# Patient Record
Sex: Female | Born: 1965 | Race: Black or African American | Hispanic: No | Smoking: Never smoker
Health system: Southern US, Community
[De-identification: ages and names within clinical notes are randomized; demographics above are authoritative.]

## PROBLEM LIST (undated history)

## (undated) HISTORY — PX: BACK SURGERY: SHX140

---

## 2015-12-27 ENCOUNTER — Encounter: Payer: Self-pay | Admitting: Emergency Medicine

## 2015-12-27 ENCOUNTER — Emergency Department: Payer: BLUE CROSS/BLUE SHIELD

## 2015-12-27 ENCOUNTER — Emergency Department
Admission: EM | Admit: 2015-12-27 | Discharge: 2015-12-27 | Disposition: A | Discharge status: Home / Self Care | Payer: BLUE CROSS/BLUE SHIELD | Attending: Emergency Medicine | Admitting: Emergency Medicine

## 2015-12-27 DIAGNOSIS — R0789 Other chest pain: Secondary | ICD-10-CM | POA: Insufficient documentation

## 2015-12-27 DIAGNOSIS — Z7982 Long term (current) use of aspirin: Secondary | ICD-10-CM | POA: Diagnosis not present

## 2015-12-27 LAB — CBC WITH DIFFERENTIAL/PLATELET
BASOS ABS: 0 10*3/uL (ref 0–0.1)
EOS ABS: 0 10*3/uL (ref 0–0.7)
Eosinophils Relative: 1 %
HCT: 34.2 % — ABNORMAL LOW (ref 35.0–47.0)
HEMOGLOBIN: 10.1 g/dL — AB (ref 12.0–16.0)
Lymphocytes Relative: 26 %
Lymphs Abs: 1.8 10*3/uL (ref 1.0–3.6)
MCH: 18.3 pg — ABNORMAL LOW (ref 26.0–34.0)
MCHC: 29.5 g/dL — AB (ref 32.0–36.0)
MCV: 62.2 fL — ABNORMAL LOW (ref 80.0–100.0)
Monocytes Absolute: 0.7 10*3/uL (ref 0.2–0.9)
Neutro Abs: 4.3 10*3/uL (ref 1.4–6.5)
Platelets: 250 10*3/uL (ref 150–440)
RBC: 5.5 MIL/uL — ABNORMAL HIGH (ref 3.80–5.20)
RDW: 19.1 % — AB (ref 11.5–14.5)
WBC: 6.9 10*3/uL (ref 3.6–11.0)

## 2015-12-27 LAB — COMPREHENSIVE METABOLIC PANEL
ALT: 17 U/L (ref 14–54)
ANION GAP: 7 (ref 5–15)
AST: 20 U/L (ref 15–41)
Albumin: 3.7 g/dL (ref 3.5–5.0)
Alkaline Phosphatase: 38 U/L (ref 38–126)
BILIRUBIN TOTAL: 0.6 mg/dL (ref 0.3–1.2)
BUN: 13 mg/dL (ref 6–20)
CHLORIDE: 108 mmol/L (ref 101–111)
CO2: 22 mmol/L (ref 22–32)
Calcium: 8.9 mg/dL (ref 8.9–10.3)
Creatinine, Ser: 0.65 mg/dL (ref 0.44–1.00)
Glucose, Bld: 85 mg/dL (ref 65–99)
POTASSIUM: 3.9 mmol/L (ref 3.5–5.1)
Sodium: 137 mmol/L (ref 135–145)
TOTAL PROTEIN: 6.9 g/dL (ref 6.5–8.1)

## 2015-12-27 LAB — LIPASE, BLOOD: LIPASE: 21 U/L (ref 11–51)

## 2015-12-27 LAB — TROPONIN I

## 2015-12-27 MED ORDER — KETOROLAC TROMETHAMINE 30 MG/ML IJ SOLN
30.0000 mg | Freq: Once | INTRAMUSCULAR | Status: AC
  Administered 2015-12-27: 30 mg via INTRAVENOUS
  Filled 2015-12-27: qty 1

## 2015-12-27 NOTE — ED Triage Notes (Signed)
Pt presents with left sided chest pain radiating down into left arm started around 1030 today while in the shower. C/o pressure in chest. NAD skin warm and dry.

## 2015-12-27 NOTE — ED Provider Notes (Addendum)
[facility] Emergency Department Provider Note  ____________________________________________   I have reviewed the triage vital signs and the nursing notes.   HISTORY  Chief Complaint Chest Pain    HPI Linda Young is a 50 y.o. female who has family history of CAD but herself has no other significant risk factors for CAD or blood clot presents today complaining of left-sided chest wall pain. Patient states that she was raising her arm over her head to shampoo her head in the shower and she felt like she pulled a muscle in the left upper chest. At this time she has no pain elicited touches that area or changes position or pulls up in the bed. The pain is "like a muscle pull". She has no numbness or weakness. She denies any pleuritic chest pain shortness of breath nausea or vomiting. She has no personal or family history of PE or DVT. She's had no recent travel. No calf pain or swelling. He is not on any exogenous estrogens. She has not had any recent surgery. She has been short no risk factors for PE. Patient does work out and exercise regularly and is known to have a low heart rate, she has a history of mild anemia. However, she has had no exertional symptoms. At this time, she states it only hurts if she pulls herself up in the bed using that arm or touches it. She has no weakness. She did have a "tingling sensation" in her upper arm but that is gone now.      History reviewed. No pertinent past medical history.  There are no active problems to display for this patient.   Past Surgical History:  Procedure Laterality Date  . BACK SURGERY      Current Outpatient Rx  . Order #: [id number] Class: Historical Med  . Order #: [id number] Class: Historical Med  . Order #: [id number] Class: Historical Med    Allergies Review of patient's allergies indicates no known allergies.  No family history on file.  Social History Social History  Substance Use Topics  .  Smoking status: Never Smoker  . Smokeless tobacco: Never Used  . Alcohol use No    Review of Systems Constitutional: No fever/chills Eyes: No visual changes. ENT: No sore throat. No stiff neck no neck pain Cardiovascular: See history of present illness regarding chest pain. Respiratory: Denies shortness of breath. Gastrointestinal:   no vomiting.  No diarrhea.  No constipation. Genitourinary: Negative for dysuria. Musculoskeletal: Negative lower extremity swelling Skin: Negative for rash. Neurological: Negative for severe headaches, focal weakness or numbness. 10-point ROS otherwise negative.  ____________________________________________   PHYSICAL EXAM:  VITAL SIGNS: ED Triage Vitals  Enc Vitals Group     BP 12/27/15 1144 113/62     Pulse Rate 12/27/15 1144 (!) 51     Resp 12/27/15 1144 20     Temp 12/27/15 1144 98 F (36.7 C)     Temp Source 12/27/15 1144 Oral     SpO2 12/27/15 1144 100 %     Weight --      Height --      Head Circumference --      Peak Flow --      Pain Score 12/27/15 1142 6     Pain Loc --      Pain Edu? --      Excl. in GC? --     Constitutional: Alert and oriented. Well appearing and in no acute distress. Eyes: Conjunctivae are normal. PERRL. EOMI. Head:  Atraumatic. Nose: No congestion/rhinnorhea. Mouth/Throat: Mucous membranes are moist.  Oropharynx non-erythematous. Neck: No stridor.   Nontender with no meningismus Cardiovascular: Normal rate, regular rhythm. Grossly normal heart sounds.  Good peripheral circulation. Respiratory: Normal respiratory effort.  No retractions. Lungs CTAB. Chest: Patient is status palpation left chest wall pectoralis muscle area patient states "ouch that the pain right there" and pulls back, there is no crepitus is no flail chest, there is no shingles or other lesions. Patient also has minimal tenderness to palpation of the trapezius muscle which is also productive of her discomfort. Abdominal: Soft and  nontender. No distention. No guarding no rebound Back:  There is no focal tenderness or step off.  there is no midline tenderness there are no lesions noted. there is no CVA tenderness Musculoskeletal: No lower extremity tenderness, no upper extremity tenderness. No joint effusions, no DVT signs strong distal pulses no edema, strong distal pulses. Neurologic:  Normal speech and language. No gross focal neurologic deficits are appreciated.  Skin:  Skin is warm, dry and intact. No rash noted. Psychiatric: Mood and affect are normal. Speech and behavior are normal.  ____________________________________________   LABS (all labs ordered are listed, but only abnormal results are displayed)  Labs Reviewed  CBC WITH DIFFERENTIAL/PLATELET - Abnormal; Notable for the following:       Result Value   RBC 5.50 (*)    Hemoglobin 10.1 (*)    HCT 34.2 (*)    MCV 62.2 (*)    MCH 18.3 (*)    MCHC 29.5 (*)    RDW 19.1 (*)    All other components within normal limits  COMPREHENSIVE METABOLIC PANEL  LIPASE, BLOOD  TROPONIN I   ____________________________________________  EKG  I personally interpreted any EKGs ordered by me or triage Sinus bradycardia rate 52 bpm no acute ST elevation or acute ST depression, borderline LAD. No acute ischemic changes noted ____________________________________________  RADIOLOGY  I reviewed any imaging ordered by me or triage that were performed during my shift and, if possible, patient and/or family made aware of any abnormal findings. ____________________________________________   PROCEDURES  Procedure(s) performed: None  Procedures  Critical Care performed: None  ____________________________________________   INITIAL IMPRESSION / ASSESSMENT AND PLAN / ED COURSE  Pertinent labs & imaging results that were available during my care of the patient were reviewed by me and considered in my medical decision making (see chart for details). Patient presents  today complaining of very reproducible chest wall pain after raising her arm over her head. It is very reproducible especially if one does see exact motion that she did to elicit the pain in the first place. She has very low risk factors for ACS and I feel with 2 sets chronic markers and this exam she is safe to go home. Patient agrees per she is eating ice. This time in no acute distress. We did give her Toradol and her pain is nearly gone. Chest x-ray and blood work is quite reassuring EKG is reassuring. Patient does exercises a great deal and I think this likely splinter bradycardia presently no evidence of inferior wall MI. Patient is perk negative, has reproduce will chest wall pain I do not believe that a CT scan is in the patient's best interest given the radiation burden and low probability of finding pathology.  At this time, there does not appear to be clinical evidence to support the diagnosis of pulmonary embolus, dissection, myocarditis, endocarditis, pericarditis, pericardial tamponade, acute coronary syndrome, pneumothorax, pneumonia,  or any other acute intrathoracic pathology that will require admission or acute intervention. Nor is there evidence of any significant intra-abdominal pathology causing this discomfort.  ----------------------------------------- 3:35 PM on 12/27/2015 -----------------------------------------  Signed out to dr. Sharma Covert at the end of my shift.  Clinical Course   ____________________________________________   FINAL CLINICAL IMPRESSION(S) / ED DIAGNOSES  Final diagnoses:  None      This chart was dictated using voice recognition software.  Despite best efforts to proofread,  errors can occur which can change meaning.      Jeanmarie Plant, MD 12/27/15 1459    Jeanmarie Plant, MD 12/27/15 1535

## 2016-08-10 ENCOUNTER — Emergency Department
Admission: EM | Admit: 2016-08-10 | Discharge: 2016-08-10 | Disposition: A | Discharge status: Home / Self Care | Payer: BLUE CROSS/BLUE SHIELD | Attending: Emergency Medicine | Admitting: Emergency Medicine

## 2016-08-10 DIAGNOSIS — Z79899 Other long term (current) drug therapy: Secondary | ICD-10-CM | POA: Insufficient documentation

## 2016-08-10 DIAGNOSIS — R51 Headache: Secondary | ICD-10-CM | POA: Diagnosis present

## 2016-08-10 DIAGNOSIS — R519 Headache, unspecified: Secondary | ICD-10-CM

## 2016-08-10 DIAGNOSIS — Z7982 Long term (current) use of aspirin: Secondary | ICD-10-CM | POA: Insufficient documentation

## 2016-08-10 DIAGNOSIS — I10 Essential (primary) hypertension: Secondary | ICD-10-CM | POA: Diagnosis not present

## 2016-08-10 LAB — BASIC METABOLIC PANEL
ANION GAP: 4 — AB (ref 5–15)
BUN: 13 mg/dL (ref 6–20)
CALCIUM: 8.9 mg/dL (ref 8.9–10.3)
CO2: 27 mmol/L (ref 22–32)
Chloride: 107 mmol/L (ref 101–111)
Creatinine, Ser: 0.65 mg/dL (ref 0.44–1.00)
GFR calc Af Amer: >60 mL/min (ref >60)
GFR calc non Af Amer: >60 mL/min (ref >60)
GLUCOSE: 85 mg/dL (ref 65–99)
POTASSIUM: 3.8 mmol/L (ref 3.5–5.1)
Sodium: 138 mmol/L (ref 135–145)

## 2016-08-10 LAB — CBC
HEMATOCRIT: 34.3 % — AB (ref 35.0–47.0)
Hemoglobin: 10.6 g/dL — ABNORMAL LOW (ref 12.0–16.0)
MCH: 20.6 pg — AB (ref 26.0–34.0)
MCHC: 30.9 g/dL — ABNORMAL LOW (ref 32.0–36.0)
MCV: 66.4 fL — AB (ref 80.0–100.0)
Platelets: 281 10*3/uL (ref 150–440)
RBC: 5.17 MIL/uL (ref 3.80–5.20)
RDW: 19 % — ABNORMAL HIGH (ref 11.5–14.5)
WBC: 6.9 10*3/uL (ref 3.6–11.0)

## 2016-08-10 LAB — TROPONIN I: Troponin I: <0.03 ng/mL (ref <0.03)

## 2016-08-10 MED ORDER — KETOROLAC TROMETHAMINE 30 MG/ML IJ SOLN
30.0000 mg | Freq: Once | INTRAMUSCULAR | Status: AC
Start: 2016-08-10 — End: 2016-08-10
  Administered 2016-08-10: 30 mg via INTRAVENOUS
  Filled 2016-08-10: qty 1

## 2016-08-10 MED ORDER — DIPHENHYDRAMINE HCL 50 MG/ML IJ SOLN
25.0000 mg | Freq: Once | INTRAMUSCULAR | Status: AC
  Administered 2016-08-10: 25 mg via INTRAVENOUS
  Filled 2016-08-10: qty 1

## 2016-08-10 MED ORDER — METOCLOPRAMIDE HCL 5 MG/ML IJ SOLN
10.0000 mg | Freq: Once | INTRAMUSCULAR | Status: AC
  Administered 2016-08-10: 10 mg via INTRAVENOUS
  Filled 2016-08-10: qty 2

## 2016-08-10 MED ORDER — BUTALBITAL-APAP-CAFFEINE 50-325-40 MG PO TABS: 1.0000 | ORAL_TABLET | Freq: Four times a day (QID) | ORAL | 0 refills | Status: AC | PRN

## 2016-08-10 MED ORDER — SODIUM CHLORIDE 0.9 % IV BOLUS (SEPSIS)
1000.0000 mL | Freq: Once | INTRAVENOUS | Status: AC
  Administered 2016-08-10: 1000 mL via INTRAVENOUS

## 2016-08-10 NOTE — ED Triage Notes (Signed)
Pt in with co headache since this am, hx of migraines but states feels the same. States has blurry vision at times and some visual disturbances, no light sensitivity but does have nausea.

## 2016-08-10 NOTE — ED Provider Notes (Signed)
[facility] Emergency Department Provider Note  Time seen: 9:07 PM  I have reviewed the triage vital signs and the nursing notes.   HISTORY  Chief Complaint Headache    HPI Linda Young is a 51 y.o. female with no past medical history who presents the emergency department with high blood pressure and a headache. According to the patient throughout the day today she has had an intermittent headache which has become constant, moderate in severity. Describes as a pressure sensation across her entire forehead and pushing on her temples. Denies photophobia, but states she has been seeing spots in her vision today and feels like her vision might be somewhat blurred. States she took her blood pressure due to the symptoms and it was 130 systolic she states her normal systolic blood pressure is 100-110. She became concerned so she came to the emergency department for evaluation. Denies any chest pain, abdominal pain, nausea, vomiting, diarrhea. Denies any weakness or numbness. Continue same moderate headache at this time with a blood pressure currently 135/82, the patient again states this is very high for her.  No past medical history on file.  There are no active problems to display for this patient.   Past Surgical History:  Procedure Laterality Date  . BACK SURGERY      Prior to Admission medications   Medication Sig Start Date End Date Taking? Authorizing Provider  aspirin EC 81 MG tablet Take 81 mg by mouth daily.    Historical Provider, MD  ferrous sulfate 325 (65 FE) MG tablet Take 325 mg by mouth daily with breakfast.    Historical Provider, MD  ibuprofen (ADVIL,MOTRIN) 200 MG tablet Take 200 mg by mouth every 6 (six) hours as needed.    Historical Provider, MD    No Known Allergies  No family history on file.  Social History Social History  Substance Use Topics  . Smoking status: Never Smoker  . Smokeless tobacco: Never Used  . Alcohol use No     Review of Systems Constitutional: Negative for fever. Cardiovascular: Negative for chest pain. Respiratory: Negative for shortness of breath. Gastrointestinal: Negative for abdominal pain Neurological: Moderate headache. Denies focal weakness or numbness. 10-point ROS otherwise negative.  ____________________________________________   PHYSICAL EXAM:  VITAL SIGNS: ED Triage Vitals  Enc Vitals Group     BP 08/10/16 2045 135/82     Pulse Rate 08/10/16 2045 64     Resp 08/10/16 2045 18     Temp 08/10/16 2045 98.2 F (36.8 C)     Temp Source 08/10/16 2045 Oral     SpO2 08/10/16 2045 100 %     Weight 08/10/16 2046 190 lb (86.2 kg)     Height 08/10/16 2046 5\' 4"  (1.626 m)     Head Circumference --      Peak Flow --      Pain Score 08/10/16 2046 5     Pain Loc --      Pain Edu? --      Excl. in GC? --     Constitutional: Alert and oriented. Well appearing and in no distress. Eyes: Normal exam, No photophobia. ENT   Head: Normocephalic and atraumatic.   Mouth/Throat: Mucous membranes are moist. Cardiovascular: Normal rate, regular rhythm. No murmur Respiratory: Normal respiratory effort without tachypnea nor retractions. Breath sounds are clear Gastrointestinal: Soft and nontender. No distention.   Musculoskeletal: Nontender with normal range of motion in all extremities.  Neurologic:  Normal speech and language. No  gross focal neurologic deficits. Equal grip strengths bilaterally. 5/5 motor in all extremities. No pronator drift. Cranial nerves intact.  Skin:  Skin is warm, dry and intact.  Psychiatric: Mood and affect are normal.   ____________________________________________    INITIAL IMPRESSION / ASSESSMENT AND PLAN / ED COURSE  Pertinent labs & imaging results that were available during my care of the patient were reviewed by me and considered in my medical decision making (see chart for details).  The patient presents to the emergency department for a  headache and hypertension. Patient states mild headache earlier today which progressively worsened. Patient's main concern appears to be her elevated blood pressure currently 135 systolic which she states is very high for her. We will check labs, treat with Toradol, Reglan, Benadryl and IV fluids and closely monitor in the emergency department. Overall the patient appears very well with a normal physical exam including neurological exam.   Patient's blood pressures down to 102, continues to state mild headache. Patient appears very well, resting comfortably in bed. Patient's labs are within normal limits including negative troponin. We will discharge the patient from the emergency department with PCP follow-up. I will prescribe Fioricet if the patient continues to have a headache tomorrow.  ____________________________________________   FINAL CLINICAL IMPRESSION(S) / ED DIAGNOSES  Hypertension Headache    Minna AntisKevin Duayne Brideau, MD 08/10/16 2250

## 2017-05-21 IMAGING — CR DG CHEST 2V
2 series · 2 of 2 positions shown · non-contrast
Comparison: None.

CLINICAL DATA: Chest pain and shortness of breath

EXAM:
CHEST  2 VIEW

[chest pa]
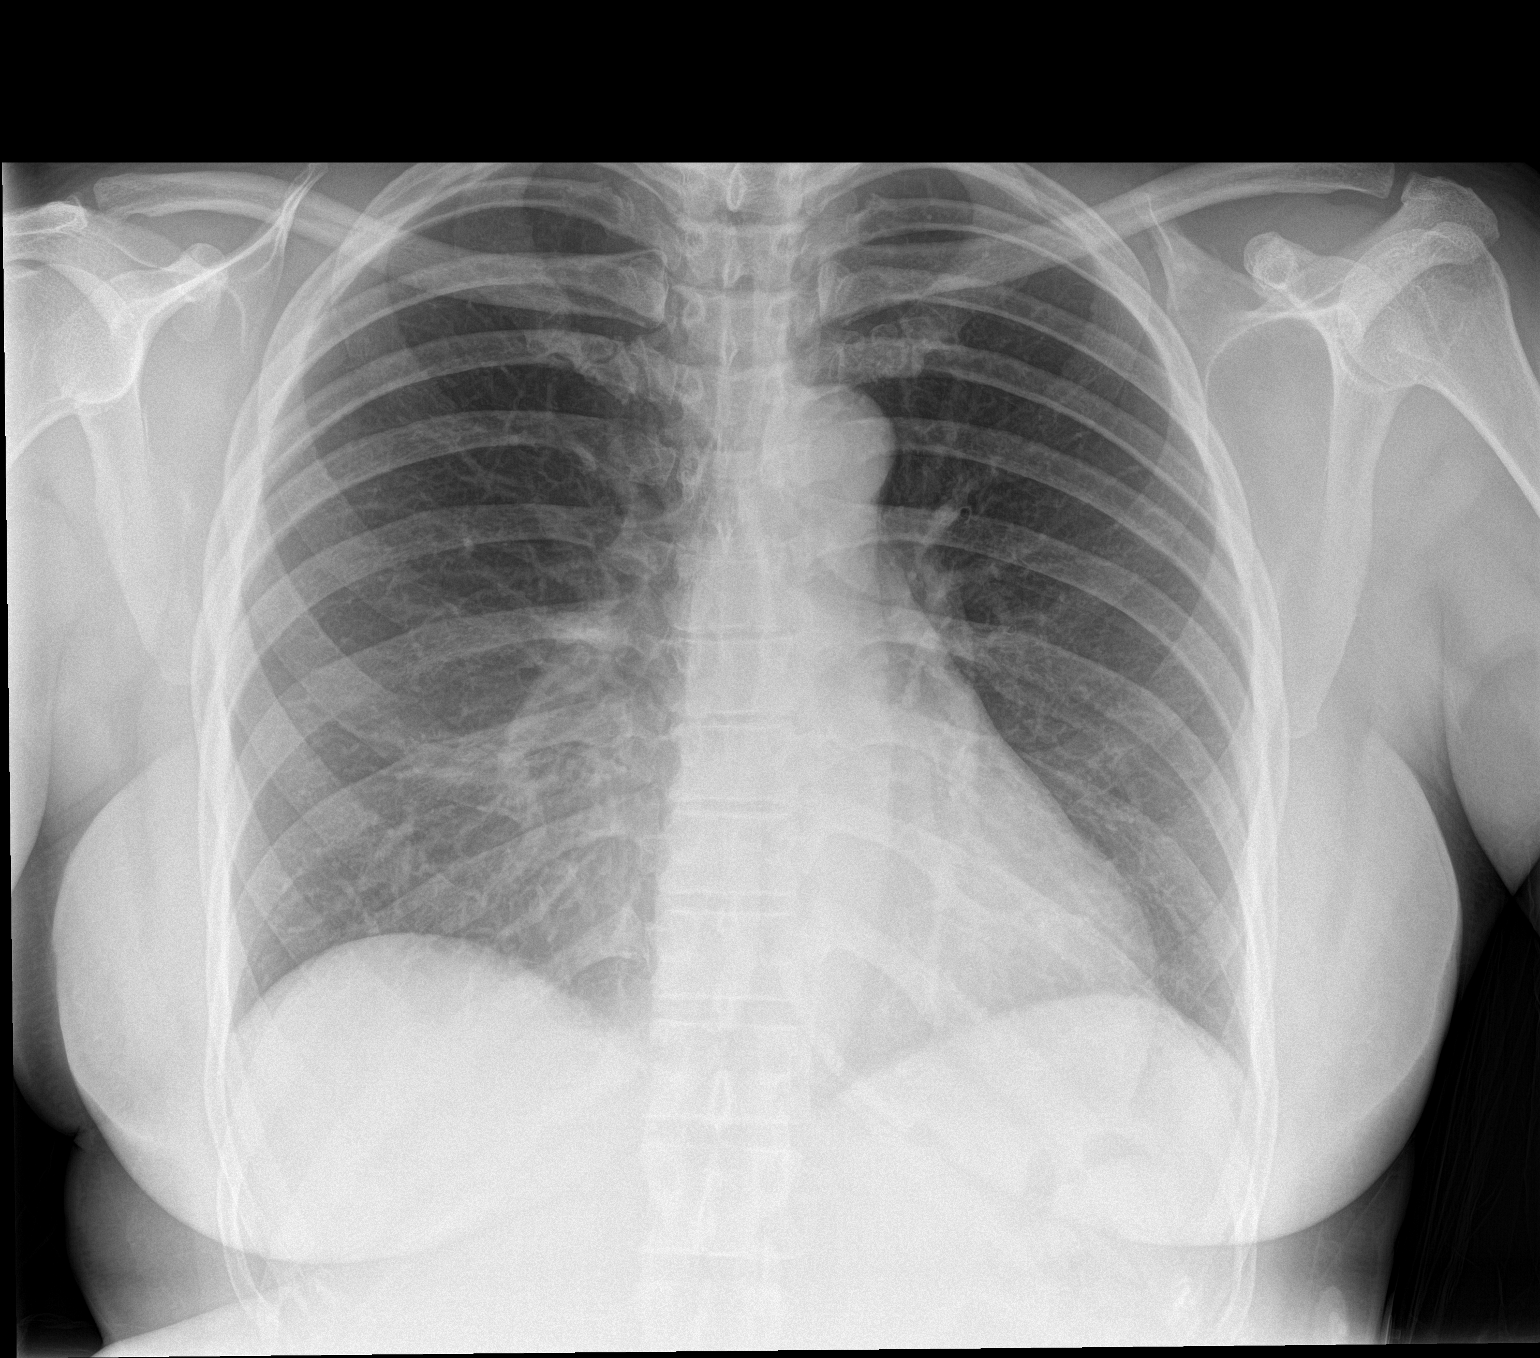

[chest lat]
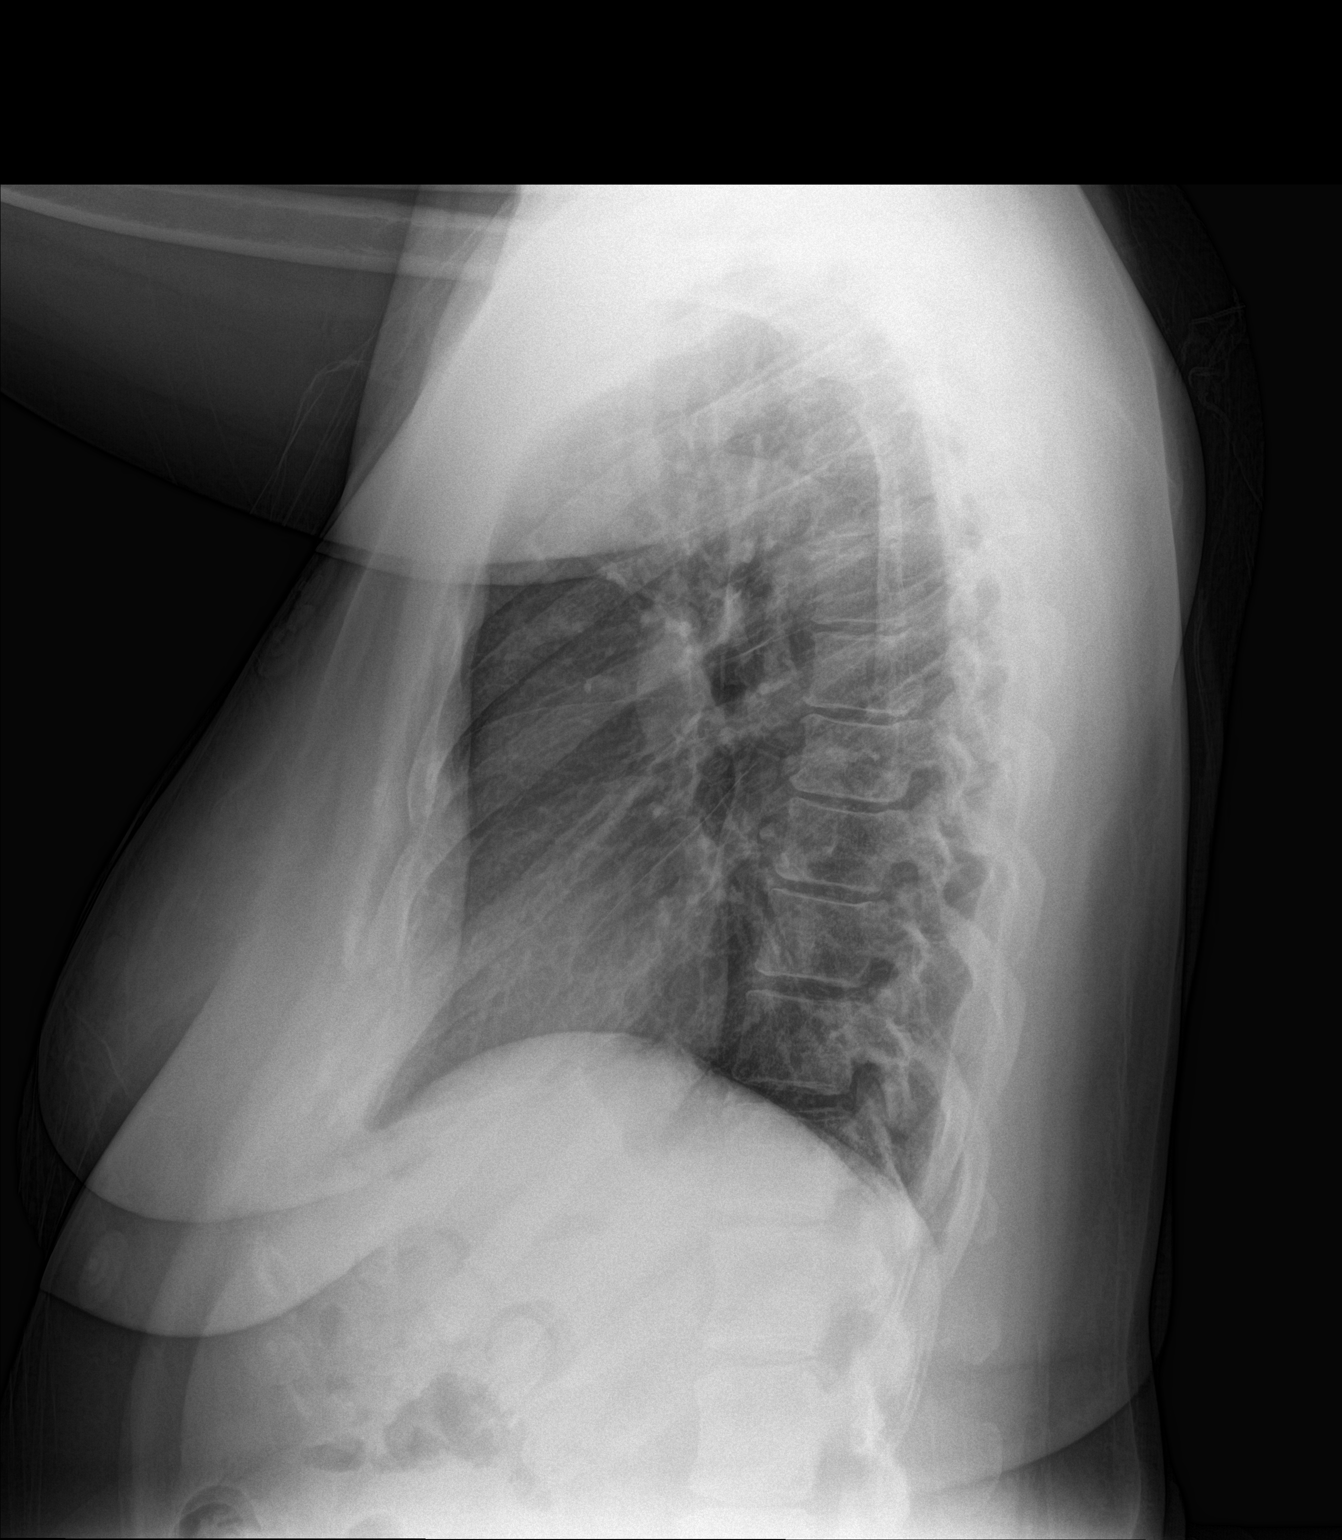

[2 of 2 positions shown; findings below may reference images not displayed]

FINDINGS: Lungs are clear. The heart size and pulmonary vascularity are
normal. No adenopathy. No pneumothorax. No bone lesions.
IMPRESSION: No edema or consolidation.

## 2018-03-07 ENCOUNTER — Encounter: Payer: Self-pay | Admitting: Emergency Medicine

## 2018-03-07 ENCOUNTER — Other Ambulatory Visit: Payer: Self-pay

## 2018-03-07 ENCOUNTER — Emergency Department
Admission: EM | Admit: 2018-03-07 | Discharge: 2018-03-08 | Disposition: A | Discharge status: Home / Self Care | Payer: BLUE CROSS/BLUE SHIELD | Attending: Emergency Medicine | Admitting: Emergency Medicine

## 2018-03-07 DIAGNOSIS — Z7982 Long term (current) use of aspirin: Secondary | ICD-10-CM | POA: Insufficient documentation

## 2018-03-07 DIAGNOSIS — R109 Unspecified abdominal pain: Secondary | ICD-10-CM

## 2018-03-07 DIAGNOSIS — R1032 Left lower quadrant pain: Secondary | ICD-10-CM | POA: Insufficient documentation

## 2018-03-07 DIAGNOSIS — Z79899 Other long term (current) drug therapy: Secondary | ICD-10-CM | POA: Diagnosis not present

## 2018-03-07 LAB — CBC WITH DIFFERENTIAL/PLATELET
Basophils Absolute: 0.1 10*3/uL (ref 0–0.1)
Basophils Relative: 1 %
Eosinophils Absolute: 0.1 10*3/uL (ref 0–0.7)
Eosinophils Relative: 1 %
HCT: 36.2 % (ref 35.0–47.0)
HEMOGLOBIN: 11.4 g/dL — AB (ref 12.0–16.0)
LYMPHS ABS: 3.4 10*3/uL (ref 1.0–3.6)
LYMPHS PCT: 28 %
MCH: 20.4 pg — AB (ref 26.0–34.0)
MCHC: 31.5 g/dL — ABNORMAL LOW (ref 32.0–36.0)
MCV: 64.7 fL — AB (ref 80.0–100.0)
Monocytes Absolute: 0.8 10*3/uL (ref 0.2–0.9)
Monocytes Relative: 7 %
NEUTROS PCT: 63 %
Neutro Abs: 7.5 10*3/uL — ABNORMAL HIGH (ref 1.4–6.5)
Platelets: 322 10*3/uL (ref 150–440)
RBC: 5.59 MIL/uL — AB (ref 3.80–5.20)
RDW: 18.6 % — ABNORMAL HIGH (ref 11.5–14.5)
WBC: 11.9 10*3/uL — ABNORMAL HIGH (ref 3.6–11.0)

## 2018-03-07 LAB — URINALYSIS, COMPLETE (UACMP) WITH MICROSCOPIC
BILIRUBIN URINE: NEGATIVE
Glucose, UA: NEGATIVE mg/dL
HGB URINE DIPSTICK: NEGATIVE
Ketones, ur: NEGATIVE mg/dL
LEUKOCYTES UA: NEGATIVE
NITRITE: NEGATIVE
PH: 7 (ref 5.0–8.0)
Protein, ur: NEGATIVE mg/dL
SPECIFIC GRAVITY, URINE: 1.026 (ref 1.005–1.030)

## 2018-03-07 LAB — COMPREHENSIVE METABOLIC PANEL
ALK PHOS: 49 U/L (ref 38–126)
ALT: 15 U/L (ref 0–44)
ANION GAP: 6 (ref 5–15)
AST: 17 U/L (ref 15–41)
Albumin: 4.2 g/dL (ref 3.5–5.0)
BUN: 18 mg/dL (ref 6–20)
CHLORIDE: 104 mmol/L (ref 98–111)
CO2: 26 mmol/L (ref 22–32)
Calcium: 9.1 mg/dL (ref 8.9–10.3)
Creatinine, Ser: 0.75 mg/dL (ref 0.44–1.00)
GFR calc non Af Amer: >60 mL/min (ref >60)
Glucose, Bld: 118 mg/dL — ABNORMAL HIGH (ref 70–99)
POTASSIUM: 4 mmol/L (ref 3.5–5.1)
SODIUM: 136 mmol/L (ref 135–145)
Total Bilirubin: 0.6 mg/dL (ref 0.3–1.2)
Total Protein: 8.1 g/dL (ref 6.5–8.1)

## 2018-03-07 LAB — LACTIC ACID, PLASMA: LACTIC ACID, VENOUS: 1.4 mmol/L (ref 0.5–1.9)

## 2018-03-07 MED ORDER — MORPHINE SULFATE (PF) 4 MG/ML IV SOLN
4.0000 mg | Freq: Once | INTRAVENOUS | Status: AC
  Administered 2018-03-07: 4 mg via INTRAVENOUS
  Filled 2018-03-07: qty 1

## 2018-03-07 MED ORDER — ONDANSETRON HCL 4 MG/2ML IJ SOLN
4.0000 mg | Freq: Once | INTRAMUSCULAR | Status: AC
  Administered 2018-03-07: 4 mg via INTRAVENOUS
  Filled 2018-03-07: qty 2

## 2018-03-07 MED ORDER — SODIUM CHLORIDE 0.9 % IV BOLUS
1000.0000 mL | Freq: Once | INTRAVENOUS | Status: AC
  Administered 2018-03-07: 1000 mL via INTRAVENOUS

## 2018-03-07 NOTE — ED Notes (Signed)
Pt reports back pain that started on Sunday. States lower left back pain after waking up from sleep. Went to urgent care on Monday and was given antibiotics that she states is not working for her.

## 2018-03-07 NOTE — ED Triage Notes (Signed)
Pt c/o L lower back pain at this time x 4 days. Pt also c/o dark urine.

## 2018-03-07 NOTE — ED Provider Notes (Signed)
[facility] Emergency Department Provider Note  ____________________________________________  Time seen: Approximately 9:50 PM  I have reviewed the triage vital signs and the nursing notes.   HISTORY  Chief Complaint Back Pain    HPI Linda Young is a 52 y.o. female who presents the emergency department complaining of left-sided flank pain.  Patient presents emergency department with worsening of left flank pain.   Patient reports that she developed lower back pain 4 days prior.  Patient reports that she awoke 4 days ago with back pain to the left side.  Patient reports that symptoms did not appear to be musculoskeletal so she presented to urgent care on Monday.  After assessment, patient was diagnosed with UTI based on symptoms and prescribed antibiotics.  Patient reports that she has been taking same but left flank pain has increased.  Initially, patient had dysuria, suprapubic pain, lower back pain.  Patient reports that dysuria and polyuria have improved.  She reports that her urine is dark-colored but no foul odor.  Patient reports increasing lower back pain on the left flank.  She denies any hematuria.  No history of kidney stone.  Patient denies any fevers or chills.  Patient reports that the pain does radiate from the left flank into the left lower quadrant.  Patient denies any diarrhea or constipation.  No history of diverticulitis.  Patient denies any radicular symptoms, bowel or bladder dysfunction, saddle anesthesia, paresthesias.  Patient does have a history of back surgery.   History reviewed. No pertinent past medical history.  There are no active problems to display for this patient.   Past Surgical History:  Procedure Laterality Date  . BACK SURGERY      Prior to Admission medications   Medication Sig Start Date End Date Taking? Authorizing Provider  aspirin EC 81 MG tablet Take 81 mg by mouth daily.    [provider]  cephALEXin  (KEFLEX) 500 MG capsule Take 1 capsule (500 mg total) by mouth 4 (four) times daily. 03/08/18   Feliciana Narayan, Delorise Royals, PA-C  ferrous sulfate 325 (65 FE) MG tablet Take 325 mg by mouth daily with breakfast.    [provider]  HYDROcodone-acetaminophen (NORCO/VICODIN) 5-325 MG tablet Take 1 tablet by mouth every 4 (four) hours as needed for moderate pain. 03/08/18   Christohper Dube, Delorise Royals, PA-C  ibuprofen (ADVIL,MOTRIN) 200 MG tablet Take 200 mg by mouth every 6 (six) hours as needed.    [provider]  meloxicam (MOBIC) 15 MG tablet Take 1 tablet (15 mg total) by mouth daily. 03/08/18   Kaelon Weekes, Delorise Royals, PA-C  methocarbamol (ROBAXIN) 500 MG tablet Take 1 tablet (500 mg total) by mouth 4 (four) times daily. 03/08/18   Shameca Landen, Delorise Royals, PA-C    Allergies Patient has no known allergies.  No family history on file.  Social History Social History   Tobacco Use  . Smoking status: Never Smoker  . Smokeless tobacco: Never Used  Substance Use Topics  . Alcohol use: No  . Drug use: Not on file     Review of Systems  Constitutional: No fever/chills Eyes: No visual changes.  Cardiovascular: no chest pain. Respiratory: no cough. No SOB. Gastrointestinal: No abdominal pain.  No nausea, no vomiting.  No diarrhea.  No constipation. Genitourinary: Resolved dysuria. No hematuria.  Positive for left flank pain radiating to the left groin. Musculoskeletal: Positive left\left flank pain. Skin: Negative for rash, abrasions, lacerations, ecchymosis. Neurological: Negative for headaches, focal weakness or numbness.  10-point ROS otherwise negative.  ____________________________________________   PHYSICAL EXAM:  VITAL SIGNS: ED Triage Vitals  Enc Vitals Group     BP 03/07/18 1842 (!) 136/91     Pulse Rate 03/07/18 1842 97     Resp 03/07/18 1842 14     Temp 03/07/18 1842 98.9 F (37.2 C)     Temp Source 03/07/18 1842 Oral     SpO2 03/07/18 1842 98 %     Weight  03/07/18 1840 193 lb (87.5 kg)     Height 03/07/18 1840 5\' 4"  (1.626 m)     Head Circumference --      Peak Flow --      Pain Score 03/07/18 1840 6     Pain Loc --      Pain Edu? --      Excl. in GC? --      Constitutional: Alert and oriented. Well appearing and in no acute distress. Eyes: Conjunctivae are normal. PERRL. EOMI. Head: Atraumatic. Neck: No stridor.    Cardiovascular: Normal rate, regular rhythm. Normal S1 and S2.  Good peripheral circulation. Respiratory: Normal respiratory effort without tachypnea or retractions. Lungs CTAB. Good air entry to the bases with no decreased or absent breath sounds. Gastrointestinal: Bowel sounds 4 quadrants. Soft and nontender to palpation. No guarding or rigidity. No palpable masses. No distention.  Significant left-sided CVA tenderness with light pressure/percussion.   Musculoskeletal: Full range of motion to all extremities. No gross deformities appreciated.  Visualization of the thoracic and lumbar spinal regions reveals no abnormality.  No midline tenderness.  Patient is very tender left costal vertebral angle.  Significant left-sided CVA tenderness.  No tenderness to palpation over bilateral sciatic notches.  Negative straight leg raise bilaterally.  Dorsalis pedis pulse intact bilateral lower extremities.  Sensation intact and equal bilateral lower extremities. Neurologic:  Normal speech and language. No gross focal neurologic deficits are appreciated.  Skin:  Skin is warm, dry and intact. No rash noted. Psychiatric: Mood and affect are normal. Speech and behavior are normal. Patient exhibits appropriate insight and judgement.   ____________________________________________   LABS (all labs ordered are listed, but only abnormal results are displayed)  Labs Reviewed  URINALYSIS, COMPLETE (UACMP) WITH MICROSCOPIC - Abnormal; Notable for the following components:      Result Value   Color, Urine YELLOW (*)    APPearance CLEAR (*)     Bacteria, UA RARE (*)    All other components within normal limits  COMPREHENSIVE METABOLIC PANEL - Abnormal; Notable for the following components:   Glucose, Bld 118 (*)    All other components within normal limits  CBC WITH DIFFERENTIAL/PLATELET - Abnormal; Notable for the following components:   WBC 11.9 (*)    RBC 5.59 (*)    Hemoglobin 11.4 (*)    MCV 64.7 (*)    MCH 20.4 (*)    MCHC 31.5 (*)    RDW 18.6 (*)    Neutro Abs 7.5 (*)    All other components within normal limits  LACTIC ACID, PLASMA  PREGNANCY, URINE  LACTIC ACID, PLASMA  POCT PREGNANCY, URINE   ____________________________________________  EKG   ____________________________________________  RADIOLOGY I personally viewed and evaluated these images as part of my medical decision making, as well as reviewing the written report by the radiologist.  Ct Renal Stone Study  Result Date: 03/08/2018 CLINICAL DATA:  52 y/o  F; left flank pain. EXAM: CT ABDOMEN AND PELVIS WITHOUT CONTRAST TECHNIQUE: Multidetector CT imaging of  the abdomen and pelvis was performed following the standard protocol without IV contrast. COMPARISON:  None. FINDINGS: Lower chest: No acute abnormality. Hepatobiliary: Subcentimeter hypodensity within the left lobe of liver, likely cysts. Otherwise no focal liver abnormality is seen. No gallstones, gallbladder wall thickening, or biliary dilatation. Pancreas: Unremarkable. No pancreatic ductal dilatation or surrounding inflammatory changes. Spleen: Normal in size without focal abnormality. Adrenals/Urinary Tract: Adrenal glands are unremarkable. Kidneys are normal, without renal calculi, focal lesion, or hydronephrosis. Bladder is unremarkable. Stomach/Bowel: Stomach is within normal limits. Appendix appears normal. No evidence of bowel wall thickening, distention, or inflammatory changes. Vascular/Lymphatic: No significant vascular findings are present. No enlarged abdominal or pelvic lymph nodes.  Reproductive: Uterus and bilateral adnexa are unremarkable. Other: No abdominal wall hernia or abnormality. No abdominopelvic ascites. Musculoskeletal: Pectus excavatum, Haller index 3.34. L5-S1 posterior instrumented fusion, arthrodesis of posterior elements, and interbody fusion. No acute osseous abnormality identified. IMPRESSION: 1. No acute process identified as explanation for pain. 2. Pectus excavatum. Electronically Signed   By: Mitzi Hansen M.D.   On: 03/08/2018 00:44    ____________________________________________    PROCEDURES  Procedure(s) performed:    Procedures    Medications  ketorolac (TORADOL) 30 MG/ML injection 30 mg (has no administration in time range)  morphine 4 MG/ML injection 4 mg (has no administration in time range)  cefTRIAXone (ROCEPHIN) injection 1 g (has no administration in time range)  lidocaine (PF) (XYLOCAINE) 1 % injection 2.1 mL (has no administration in time range)  cephALEXin (KEFLEX) capsule 250 mg (has no administration in time range)  sodium chloride 0.9 % bolus 1,000 mL (1,000 mLs Intravenous New Bag/Given 03/07/18 2234)  morphine 4 MG/ML injection 4 mg (4 mg Intravenous Given 03/07/18 2240)  ondansetron (ZOFRAN) injection 4 mg (4 mg Intravenous Given 03/07/18 2240)     ____________________________________________   INITIAL IMPRESSION / ASSESSMENT AND PLAN / ED COURSE  Pertinent labs & imaging results that were available during my care of the patient were reviewed by me and considered in my medical decision making (see chart for details).  Review of the [place] CSRS was performed in accordance of the [facility] prior to dispensing any controlled drugs.      Patient's diagnosis is consistent with flank pain.  Patient presented to the emergency department with left flank pain radiating into the groin.  Patient had symptoms for 5 days, had been evaluated at urgent care 1 day after symptoms began.  Patient was placed on an antibiotic, she  was unsure which for a UTI given symptoms.  Patient reports that initially her urinalysis did not reveal significant indication of UTI.  Patient reports that dysuria, suprapubic pain had resolved but her flank pain had drastically worsen.  Patient denies any hematuria.  No history of kidney stone.  No fevers or chills, abdominal pain, nausea vomiting.  On exam, patient had significant left-sided CVA tenderness with no other significant findings.  Differential included nephrolithiasis, hydronephrosis, UTI, pyelonephritis, lower back pain, GI origin to include mesenteric ischemia, diverticulitis/diverticulosis, constipation.  Basic labs, CT scan was ordered.  No indication of nephrolithiasis or hydronephrosis on CT.  Patient had a mildly elevated white blood cell count, chronic anemia that was at relative baseline.  Results were otherwise reassuring.  Urinalysis did not return with any significant signs of infection to include nitrates, white blood cells, or significant hematuria, proteinuria.  Given reassuring results, exam, differential still includes pyelonephritis versus lower back pain.  With a mildly elevated white blood cell count, recent  treatment for UTI, this may have mass on the findings concerning for pyelonephritis.  As such, patient will be given Rocephin, Keflex for possible pyelonephritis.  Patient will be also treated with meloxicam, Robaxin for possible musculoskeletal origin.  Patient is also given a very limited prescription of Norco for pain.  If symptoms persist, worsen, or change, follow-up with primary care or return to the emergency department. Patient is given ED precautions to return to the ED for any worsening or new symptoms.     ____________________________________________  FINAL CLINICAL IMPRESSION(S) / ED DIAGNOSES  Final diagnoses:  Flank pain      NEW MEDICATIONS STARTED DURING THIS VISIT:  ED Discharge Orders         Ordered    meloxicam (MOBIC) 15 MG tablet  Daily      03/08/18 0103    cephALEXin (KEFLEX) 500 MG capsule  4 times daily     03/08/18 0103    methocarbamol (ROBAXIN) 500 MG tablet  4 times daily     03/08/18 0103    HYDROcodone-acetaminophen (NORCO/VICODIN) 5-325 MG tablet  Every 4 hours PRN     03/08/18 0103              This chart was dictated using voice recognition software/Dragon. Despite best efforts to proofread, errors can occur which can change the meaning. Any change was purely unintentional.    Racheal Patches, PA-C 03/08/18 0108    Rockne Menghini, MD 03/09/18 2340

## 2018-03-08 ENCOUNTER — Emergency Department: Payer: BLUE CROSS/BLUE SHIELD

## 2018-03-08 LAB — PREGNANCY, URINE: Preg Test, Ur: NEGATIVE

## 2018-03-08 LAB — POCT PREGNANCY, URINE: Preg Test, Ur: NEGATIVE

## 2018-03-08 MED ORDER — CEPHALEXIN 250 MG PO CAPS: 250.0000 mg | ORAL_CAPSULE | Freq: Once | ORAL | Status: DC

## 2018-03-08 MED ORDER — CEPHALEXIN 500 MG PO CAPS
ORAL_CAPSULE | ORAL | Status: AC
  Administered 2018-03-08: 500 mg via ORAL
  Filled 2018-03-08: qty 1

## 2018-03-08 MED ORDER — MORPHINE SULFATE (PF) 4 MG/ML IV SOLN
4.0000 mg | Freq: Once | INTRAVENOUS | Status: AC
  Administered 2018-03-08: 4 mg via INTRAVENOUS

## 2018-03-08 MED ORDER — CEFTRIAXONE SODIUM 1 G IJ SOLR
INTRAMUSCULAR | Status: AC
  Administered 2018-03-08: 1 g via INTRAMUSCULAR
  Filled 2018-03-08: qty 10

## 2018-03-08 MED ORDER — MELOXICAM 15 MG PO TABS: 15.0000 mg | ORAL_TABLET | Freq: Every day | ORAL | 0 refills | Status: AC

## 2018-03-08 MED ORDER — CEPHALEXIN 500 MG PO CAPS: 500.0000 mg | ORAL_CAPSULE | Freq: Four times a day (QID) | ORAL | 0 refills | Status: DC

## 2018-03-08 MED ORDER — KETOROLAC TROMETHAMINE 30 MG/ML IJ SOLN
INTRAMUSCULAR | Status: AC
  Administered 2018-03-08: 30 mg via INTRAVENOUS
  Filled 2018-03-08: qty 1

## 2018-03-08 MED ORDER — METHOCARBAMOL 500 MG PO TABS: 500.0000 mg | ORAL_TABLET | Freq: Four times a day (QID) | ORAL | 0 refills | Status: AC

## 2018-03-08 MED ORDER — LIDOCAINE HCL (PF) 1 % IJ SOLN
INTRAMUSCULAR | Status: AC
  Administered 2018-03-08: 2.1 mL
  Filled 2018-03-08: qty 5

## 2018-03-08 MED ORDER — LIDOCAINE HCL (PF) 1 % IJ SOLN
2.1000 mL | Freq: Once | INTRAMUSCULAR | Status: AC
  Administered 2018-03-08: 2.1 mL

## 2018-03-08 MED ORDER — KETOROLAC TROMETHAMINE 30 MG/ML IJ SOLN
30.0000 mg | Freq: Once | INTRAMUSCULAR | Status: AC
  Administered 2018-03-08: 30 mg via INTRAVENOUS

## 2018-03-08 MED ORDER — HYDROCODONE-ACETAMINOPHEN 5-325 MG PO TABS: 1.0000 | ORAL_TABLET | ORAL | 0 refills | Status: DC | PRN

## 2018-03-08 MED ORDER — MORPHINE SULFATE (PF) 4 MG/ML IV SOLN
INTRAVENOUS | Status: AC
  Administered 2018-03-08: 4 mg via INTRAVENOUS
  Filled 2018-03-08: qty 1

## 2018-03-08 MED ORDER — CEPHALEXIN 500 MG PO CAPS
500.0000 mg | ORAL_CAPSULE | Freq: Once | ORAL | Status: AC
  Administered 2018-03-08: 500 mg via ORAL

## 2018-03-08 MED ORDER — CEFTRIAXONE SODIUM 1 G IJ SOLR
1.0000 g | Freq: Once | INTRAMUSCULAR | Status: AC
  Administered 2018-03-08: 1 g via INTRAMUSCULAR

## 2018-03-08 NOTE — ED Notes (Signed)
Patient transported to CT 

## 2021-01-19 ENCOUNTER — Other Ambulatory Visit: Payer: Self-pay

## 2021-01-19 ENCOUNTER — Encounter: Payer: Self-pay | Admitting: Emergency Medicine

## 2021-01-19 ENCOUNTER — Emergency Department
Admission: EM | Admit: 2021-01-19 | Discharge: 2021-01-19 | Disposition: A | Discharge status: Home / Self Care | Payer: BC Managed Care – PPO | Attending: Emergency Medicine | Admitting: Emergency Medicine

## 2021-01-19 ENCOUNTER — Emergency Department: Payer: BC Managed Care – PPO

## 2021-01-19 DIAGNOSIS — Z7982 Long term (current) use of aspirin: Secondary | ICD-10-CM | POA: Diagnosis not present

## 2021-01-19 DIAGNOSIS — R109 Unspecified abdominal pain: Secondary | ICD-10-CM | POA: Diagnosis present

## 2021-01-19 DIAGNOSIS — R110 Nausea: Secondary | ICD-10-CM | POA: Insufficient documentation

## 2021-01-19 LAB — URINALYSIS, COMPLETE (UACMP) WITH MICROSCOPIC
Bilirubin Urine: NEGATIVE
Glucose, UA: NEGATIVE mg/dL
Hgb urine dipstick: NEGATIVE
Ketones, ur: NEGATIVE mg/dL
Leukocytes,Ua: NEGATIVE
Nitrite: NEGATIVE
Protein, ur: NEGATIVE mg/dL
Specific Gravity, Urine: 1.008 (ref 1.005–1.030)
pH: 7 (ref 5.0–8.0)

## 2021-01-19 LAB — COMPREHENSIVE METABOLIC PANEL
ALT: 22 U/L (ref 0–44)
AST: 23 U/L (ref 15–41)
Albumin: 4.5 g/dL (ref 3.5–5.0)
Alkaline Phosphatase: 66 U/L (ref 38–126)
Anion gap: 9 (ref 5–15)
BUN: 13 mg/dL (ref 6–20)
CO2: 26 mmol/L (ref 22–32)
Calcium: 9.4 mg/dL (ref 8.9–10.3)
Chloride: 103 mmol/L (ref 98–111)
Creatinine, Ser: 0.87 mg/dL (ref 0.44–1.00)
GFR, Estimated: >60 mL/min (ref >60)
Glucose, Bld: 114 mg/dL — ABNORMAL HIGH (ref 70–99)
Potassium: 4.1 mmol/L (ref 3.5–5.1)
Sodium: 138 mmol/L (ref 135–145)
Total Bilirubin: 0.6 mg/dL (ref 0.3–1.2)
Total Protein: 8.1 g/dL (ref 6.5–8.1)

## 2021-01-19 LAB — CBC
HCT: 50.4 % — ABNORMAL HIGH (ref 36.0–46.0)
Hemoglobin: 15.3 g/dL — ABNORMAL HIGH (ref 12.0–15.0)
MCH: 22.2 pg — ABNORMAL LOW (ref 26.0–34.0)
MCHC: 30.4 g/dL (ref 30.0–36.0)
MCV: 73.1 fL — ABNORMAL LOW (ref 80.0–100.0)
Platelets: 241 10*3/uL (ref 150–400)
RBC: 6.89 MIL/uL — ABNORMAL HIGH (ref 3.87–5.11)
RDW: 16.1 % — ABNORMAL HIGH (ref 11.5–15.5)
WBC: 6.8 10*3/uL (ref 4.0–10.5)
nRBC: 0 % (ref 0.0–0.2)

## 2021-01-19 LAB — LIPASE, BLOOD: Lipase: 41 U/L (ref 11–51)

## 2021-01-19 MED ORDER — SODIUM CHLORIDE 0.9 % IV BOLUS
1000.0000 mL | Freq: Once | INTRAVENOUS | Status: AC
  Administered 2021-01-19: 1000 mL via INTRAVENOUS

## 2021-01-19 MED ORDER — ONDANSETRON HCL 4 MG/2ML IJ SOLN
4.0000 mg | Freq: Once | INTRAMUSCULAR | Status: AC
  Administered 2021-01-19: 4 mg via INTRAVENOUS
  Filled 2021-01-19: qty 2

## 2021-01-19 MED ORDER — HYDROCODONE-ACETAMINOPHEN 5-325 MG PO TABS
2.0000 | ORAL_TABLET | Freq: Once | ORAL | Status: AC
  Administered 2021-01-19: 2 via ORAL
  Filled 2021-01-19: qty 2

## 2021-01-19 MED ORDER — HYDROCODONE-ACETAMINOPHEN 5-325 MG PO TABS: 1.0000 | ORAL_TABLET | ORAL | 0 refills | Status: AC | PRN

## 2021-01-19 MED ORDER — KETOROLAC TROMETHAMINE 30 MG/ML IJ SOLN
30.0000 mg | Freq: Once | INTRAMUSCULAR | Status: AC
  Administered 2021-01-19: 30 mg via INTRAVENOUS
  Filled 2021-01-19: qty 1

## 2021-01-19 NOTE — ED Provider Notes (Signed)
[facility] Emergency Department Provider Note  Time seen: 7:25 AM  I have reviewed the triage vital signs and the nursing notes.   HISTORY  Chief Complaint Flank Pain   HPI Linda Young is a 55 y.o. female with no past medical history who presents to the emergency department for left flank pain.  According to the patient she awoke with sudden severe sharp left flank pain radiating to her groin.  States she has noticed that her urine has been a little darker but denies any dysuria fever.  No vomiting or diarrhea, but was nauseated.  No history of kidney stones.   History reviewed. No pertinent past medical history.  There are no problems to display for this patient.   Past Surgical History:  Procedure Laterality Date   BACK SURGERY      Prior to Admission medications   Medication Sig Start Date End Date Taking? Authorizing Provider  aspirin EC 81 MG tablet Take 81 mg by mouth daily.    [provider]  cephALEXin (KEFLEX) 500 MG capsule Take 1 capsule (500 mg total) by mouth 4 (four) times daily. 03/08/18   Cuthriell, Delorise Royals, PA-C  ferrous sulfate 325 (65 FE) MG tablet Take 325 mg by mouth daily with breakfast.    [provider]  HYDROcodone-acetaminophen (NORCO/VICODIN) 5-325 MG tablet Take 1 tablet by mouth every 4 (four) hours as needed for moderate pain. 03/08/18   Cuthriell, Delorise Royals, PA-C  ibuprofen (ADVIL,MOTRIN) 200 MG tablet Take 200 mg by mouth every 6 (six) hours as needed.    [provider]  meloxicam (MOBIC) 15 MG tablet Take 1 tablet (15 mg total) by mouth daily. 03/08/18   Cuthriell, Delorise Royals, PA-C  methocarbamol (ROBAXIN) 500 MG tablet Take 1 tablet (500 mg total) by mouth 4 (four) times daily. 03/08/18   Cuthriell, Delorise Royals, PA-C    No Known Allergies  No family history on file.  Social History Social History   Tobacco Use   Smoking status: Never   Smokeless tobacco: Never  Vaping Use   Vaping  Use: Never used  Substance Use Topics   Alcohol use: No    Review of Systems Constitutional: Negative for fever. Cardiovascular: Negative for chest pain. Respiratory: Negative for shortness of breath. Gastrointestinal: 9/10 sharp left flank pain radiating to her groin.  Positive for nausea.  Negative for vomiting or diarrhea. Genitourinary: Dark urine without dysuria. Musculoskeletal: Negative for musculoskeletal complaints Neurological: Negative for headache All other ROS negative  ____________________________________________   PHYSICAL EXAM:  VITAL SIGNS: ED Triage Vitals  Enc Vitals Group     BP 01/19/21 0658 134/86     Pulse Rate 01/19/21 0658 78     Resp 01/19/21 0658 20     Temp 01/19/21 0658 97.9 F (36.6 C)     Temp Source 01/19/21 0658 Oral     SpO2 01/19/21 0658 99 %     Weight 01/19/21 0657 210 lb (95.3 kg)     Height 01/19/21 0657 5\' 4"  (1.626 m)     Head Circumference --      Peak Flow --      Pain Score 01/19/21 0656 9     Pain Loc --      Pain Edu? --      Excl. in GC? --    Constitutional: Alert and oriented. Well appearing and in no distress. Eyes: Normal exam ENT      Head: Normocephalic and atraumatic.  Mouth/Throat: Mucous membranes are moist. Cardiovascular: Normal rate, regular rhythm. Respiratory: Normal respiratory effort without tachypnea nor retractions. Breath sounds are clear  Gastrointestinal: Soft and nontender. No distention.   Musculoskeletal: Nontender with normal range of motion in all extremities.  Neurologic:  Normal speech and language. No gross focal neurologic deficits  Skin:  Skin is warm, dry and intact.  Psychiatric: Mood and affect are normal.  ____________________________________________    RADIOLOGY  CT negative for acute abnormality.  ____________________________________________   INITIAL IMPRESSION / ASSESSMENT AND PLAN / ED COURSE  Pertinent labs & imaging results that were available during my care of  the patient were reviewed by me and considered in my medical decision making (see chart for details).   Patient presents emergency department for left flank pain, sudden sharp severe radiating to the groin with dark urine.  Symptoms are very concerning for ureterolithiasis, differential would also include colitis or diverticulitis, UTI or pyelonephritis.  We will check labs, urinalysis, obtain a CT renal scan, we will treat with IV fluids Zofran and Toradol and continue to closely monitor.  Patient agreeable to plan of care.  Patient's work-up is essentially negative.  White blood cell count is normal.  CT is negative.  Urinalysis is just for resulted showing no significant findings.  We will discharge with short course of pain medication.  Pain could be due to musculoskeletal pain.  I discussed return precautions for any worsening pain or development of fever.  Patient agreeable to plan of care.     Linda Young was evaluated in Emergency Department on 01/19/2021 for the symptoms described in the history of present illness. She was evaluated in the context of the global COVID-19 pandemic, which necessitated consideration that the patient might be at risk for infection with the SARS-CoV-2 virus that causes COVID-19. Institutional protocols and algorithms that pertain to the evaluation of patients at risk for COVID-19 are in a state of rapid change based on information released by regulatory bodies including the CDC and federal and state organizations. These policies and algorithms were followed during the patient's care in the ED.  ____________________________________________   FINAL CLINICAL IMPRESSION(S) / ED DIAGNOSES  Left flank pain   Minna Antis, MD 01/19/21 1138

## 2021-01-19 NOTE — Discharge Instructions (Addendum)
Please take your pain medication as needed but only as prescribed.  Please return to the emergency department for any worsening abdominal pain development of fever, or any other symptom personally concerning to yourself.

## 2021-01-19 NOTE — ED Triage Notes (Signed)
Patient ambulatory to triage with steady gait, without difficulty, appears uncomfortable; st awoke this morning with left sided flank pain radiating around into abd accomp by nausea; denies hx of same

## 2021-01-20 LAB — URINE CULTURE: Culture: <10000 — AB

## 2022-03-29 ENCOUNTER — Ambulatory Visit
Admission: RE | Admit: 2022-03-29 | Discharge: 2022-03-29 | Disposition: A | Discharge status: Home / Self Care | Payer: 59 | Source: Ambulatory Visit

## 2022-03-29 VITALS — BP 143/94 | HR 100 | Temp 99.0°F | Ht 64.0 in | Wt 215.0 lb

## 2022-03-29 DIAGNOSIS — G43911 Migraine, unspecified, intractable, with status migrainosus: Secondary | ICD-10-CM

## 2022-03-29 MED ORDER — KETOROLAC TROMETHAMINE 60 MG/2ML IM SOLN
60.0000 mg | Freq: Once | INTRAMUSCULAR | Status: AC
  Administered 2022-03-29: 60 mg via INTRAMUSCULAR

## 2022-03-29 MED ORDER — DIPHENHYDRAMINE HCL 50 MG/ML IJ SOLN
50.0000 mg | Freq: Once | INTRAMUSCULAR | Status: AC
  Administered 2022-03-29: 50 mg via INTRAMUSCULAR

## 2022-03-29 MED ORDER — PROMETHAZINE HCL 25 MG/ML IJ SOLN
25.0000 mg | Freq: Once | INTRAMUSCULAR | Status: AC
  Administered 2022-03-29: 25 mg via INTRAMUSCULAR

## 2022-03-29 NOTE — Discharge Instructions (Addendum)
Go home and rest in a cool dark environment.  Take frequent sips of cool fluids so that you maintain hydration.  Do not take any aspirin, Aleve, or ibuprofen for the next 8 hours as we gave you an injection here in clinic.  If your headache does not resolve with the injections we gave you I recommend you go to the emergency department for evaluation and IV medication management to try and break your migraine cycle.

## 2022-03-29 NOTE — ED Triage Notes (Addendum)
Pt c/o migraine since Sunday, nauseated, denies any vomiting

## 2022-03-29 NOTE — ED Provider Notes (Signed)
[facility]    CSN: [id number] Arrival date & time: 03/29/22  1730      History   Chief Complaint Chief Complaint  Patient presents with   Headache   Nausea    HPI Linda Young is a 56 y.o. female.   HPI  56 year old female here for evaluation of migraine headache.  Patient reports that she has been experiencing a migraine for the last 3 days.  It is in both of her temples and this is inducing light sensitivity, noise sensitivity, and nausea.  She has taken Ubrelvy, Nurtec, and Phenergan without any improvement of her symptoms.  Her last dose of Phenergan was at 12:00 this morning.  She is here requesting a "rescue shot".  Patient was last treated for an intractable migraine on 10/14/[facility] [place] and was given Phenergan, Toradol, and Benadryl.  History reviewed. No pertinent past medical history.  There are no problems to display for this patient.   Past Surgical History:  Procedure Laterality Date   BACK SURGERY      OB History   No obstetric history on file.      Home Medications    Prior to Admission medications   Medication Sig Start Date End Date Taking? Authorizing Provider  AJOVY 225 MG/1.5ML SOAJ SMARTSIG:1 pre-filled pen syringe SUB-Q Once a Month 02/06/22  Yes [provider]  Erenumab-aooe (AIMOVIG) 140 MG/ML SOAJ INJECT 140 MG SUBCUTANEOUSLY ONCE A MONTH 05/21/20  Yes [provider]  frovatriptan (FROVA) 2.5 MG tablet Take by mouth.   Yes [provider]  ibuprofen (ADVIL,MOTRIN) 200 MG tablet Take 200 mg by mouth every 6 (six) hours as needed.   Yes [provider]  NURTEC 75 MG TBDP Take 1 tablet by mouth as needed. 12/02/21  Yes [provider]  aspirin EC 81 MG tablet Take 81 mg by mouth daily.    [provider]  cephALEXin (KEFLEX) 500 MG capsule Take 1 capsule (500 mg total) by mouth 4 (four) times daily. 03/08/18   Cuthriell, Charline Bills, PA-C  ferrous sulfate 325  (65 FE) MG tablet Take 325 mg by mouth daily with breakfast.    [provider]  meloxicam (MOBIC) 15 MG tablet Take 1 tablet (15 mg total) by mouth daily. 03/08/18   Cuthriell, Charline Bills, PA-C  methocarbamol (ROBAXIN) 500 MG tablet Take 1 tablet (500 mg total) by mouth 4 (four) times daily. 03/08/18   Cuthriell, Charline Bills, PA-C    Family History History reviewed. No pertinent family history.  Social History Social History   Tobacco Use   Smoking status: Never   Smokeless tobacco: Never  Vaping Use   Vaping Use: Never used  Substance Use Topics   Alcohol use: No     Allergies   Patient has no known allergies.   Review of Systems Review of Systems  Eyes:  Positive for photophobia.  Gastrointestinal:  Positive for nausea.  Neurological:  Positive for headaches. Negative for weakness and numbness.  Hematological: Negative.   Psychiatric/Behavioral: Negative.       Physical Exam Triage Vital Signs ED Triage Vitals  Enc Vitals Group     BP 03/29/22 1746 (!) 143/94     Pulse Rate 03/29/22 1746 100     Resp --      Temp 03/29/22 1746 99 F (37.2 C)     Temp Source 03/29/22 1746 Oral     SpO2 03/29/22 1746 99 %  Weight 03/29/22 1744 215 lb (97.5 kg)     Height 03/29/22 1744 5\' 4"  (1.626 m)     Head Circumference --      Peak Flow --      Pain Score 03/29/22 1744 9     Pain Loc --      Pain Edu? --      Excl. in [place]? --    No data found.  Updated Vital Signs BP (!) 143/94 (BP Location: Left Arm)   Pulse 100   Temp 99 F (37.2 C) (Oral)   Ht 5\' 4"  (1.626 m)   Wt 215 lb (97.5 kg)   LMP 08/03/2016   SpO2 99%   BMI 36.90 kg/m   Visual Acuity Right Eye Distance:   Left Eye Distance:   Bilateral Distance:    Right Eye Near:   Left Eye Near:    Bilateral Near:     Physical Exam Vitals and nursing note reviewed.  Constitutional:      Appearance: She is well-developed. She is not ill-appearing or toxic-appearing.  HENT:     Head:  Normocephalic and atraumatic.     Mouth/Throat:     Mouth: Mucous membranes are moist.     Pharynx: Oropharynx is clear.  Eyes:     General: No scleral icterus.    Extraocular Movements: Extraocular movements intact.     Right eye: Normal extraocular motion.     Left eye: Normal extraocular motion.     Pupils: Pupils are equal, round, and reactive to light. Pupils are equal.  Cardiovascular:     Rate and Rhythm: Normal rate and regular rhythm.     Heart sounds: Normal heart sounds. No murmur heard.    No friction rub. No gallop.  Pulmonary:     Effort: Pulmonary effort is normal.     Breath sounds: Normal breath sounds. No wheezing, rhonchi or rales.  Musculoskeletal:     Cervical back: Normal range of motion and neck supple.  Skin:    General: Skin is warm and dry.     Capillary Refill: Capillary refill takes less than 2 seconds.  Neurological:     Mental Status: She is alert and oriented to person, place, and time.     GCS: GCS eye subscore is 4. GCS verbal subscore is 5. GCS motor subscore is 6.     Cranial Nerves: No cranial nerve deficit.     Sensory: No sensory deficit.     Motor: No weakness.  Psychiatric:        Mood and Affect: Mood normal.        Speech: Speech normal.        Behavior: Behavior normal.      UC Treatments / Results  Labs (all labs ordered are listed, but only abnormal results are displayed) Labs Reviewed - No data to display  EKG   Radiology No results found.  Procedures Procedures (including critical care time)  Medications Ordered in UC Medications  promethazine (PHENERGAN) injection 25 mg (has no administration in time range)  diphenhydrAMINE (BENADRYL) injection 50 mg (has no administration in time range)  ketorolac (TORADOL) injection 60 mg (has no administration in time range)    Initial Impression / Assessment and Plan / UC Course  I have reviewed the triage vital signs and the nursing notes.  Pertinent labs & imaging  results that were available during my care of the patient were reviewed by me and considered in my medical decision making (  see chart for details).   Patient is a pleasant, nontoxic-appearing 56 year old female here for evaluation of intractable migraines been going on for the past 3 days.  She reports that this is her typical pattern as it is in both of her temples.  This is associated with light and sound sensitivity.  It is not responded to her CGRP antagonists or Phenergan at home.  She is requesting an injection of medication here to break her cycle.  She [facility] at [facility] in May with Phenergan, Toradol, and Benadryl.  I will order 25 of Phenergan, 50 Benadryl, and 60 of Toradol IM here and discharged the patient home.  I will have her resume her normal medications tomorrow but abstain from any NSAIDs for the next 8 hours.   Final Clinical Impressions(s) / UC Diagnoses   Final diagnoses:  Intractable migraine with status migrainosus, unspecified migraine type     Discharge Instructions      Go home and rest in a cool dark environment.  Take frequent sips of cool fluids so that you maintain hydration.  Do not take any aspirin, Aleve, or ibuprofen for the next 8 hours as we gave you an injection here in clinic.  If your headache does not resolve with the injections we gave you I recommend you go to the emergency department for evaluation and IV medication management to try and break your migraine cycle.     ED Prescriptions   None    PDMP not reviewed this encounter.   Becky Augusta, NP 03/29/22 [phone]

## 2022-06-21 ENCOUNTER — Ambulatory Visit
Admission: EM | Admit: 2022-06-21 | Discharge: 2022-06-21 | Disposition: A | Discharge status: Home / Self Care | Payer: No Typology Code available for payment source | Attending: Emergency Medicine | Admitting: Emergency Medicine

## 2022-06-21 DIAGNOSIS — J069 Acute upper respiratory infection, unspecified: Secondary | ICD-10-CM | POA: Diagnosis not present

## 2022-06-21 DIAGNOSIS — H66003 Acute suppurative otitis media without spontaneous rupture of ear drum, bilateral: Secondary | ICD-10-CM

## 2022-06-21 MED ORDER — IPRATROPIUM BROMIDE 0.06 % NA SOLN: 2.0000 | Freq: Four times a day (QID) | NASAL | 12 refills | Status: AC

## 2022-06-21 MED ORDER — AMOXICILLIN-POT CLAVULANATE 875-125 MG PO TABS: 1.0000 | ORAL_TABLET | Freq: Two times a day (BID) | ORAL | 0 refills | Status: AC

## 2022-06-21 NOTE — ED Triage Notes (Signed)
Pt c/o sore throat,HA & bodyaches x14 days.

## 2022-06-21 NOTE — Discharge Instructions (Signed)
Take the Augmentin twice daily for 10 days with food for treatment of your ear infection.  Take an over-the-counter probiotic 1 hour after each dose of antibiotic to prevent diarrhea.  Use over-the-counter Tylenol and ibuprofen as needed for pain or fever.  Place a hot water bottle, or heating pad, underneath your pillowcase at night to help dilate up your ear and aid in pain relief as well as resolution of the infection.  Use the Atrovent nasal spray, 2 squirts in each nostril every 6 hours, as needed for congestion and postnasal drip.  Gargle with warm salt water 2-3 times a day to soothe your throat, aid in pain relief, and aid in healing.  Take over-the-counter ibuprofen according to the package instructions as needed for pain.  You can also use Chloraseptic or Sucrets lozenges, 1 lozenge every 2 hours as needed for throat pain.  If you develop any new or worsening symptoms return for reevaluation.

## 2022-06-21 NOTE — ED Provider Notes (Signed)
[facility]    CSN: [id number] Arrival date & time: 06/21/22  1950      History   Chief Complaint Chief Complaint  Patient presents with   Sore Throat        Headache   Generalized Body Aches    .    HPI Linda Young is a 57 y.o. female.   HPI  57 year old female here for evaluation of respiratory complaints.  The patient reports that for the last 14 days she has been having waxing and waning sore throat, headache, and bodyaches.  Yesterday she developed runny nose, nasal congestion, and also a nonproductive cough.  She denies any fever, shortness breath, or wheezing.  History reviewed. No pertinent past medical history.  There are no problems to display for this patient.   Past Surgical History:  Procedure Laterality Date   BACK SURGERY      OB History   No obstetric history on file.      Home Medications    Prior to Admission medications   Medication Sig Start Date End Date Taking? Authorizing Provider  AJOVY 225 MG/1.5ML SOAJ SMARTSIG:1 pre-filled pen syringe SUB-Q Once a Month 02/06/22  Yes [provider]  amoxicillin-clavulanate (AUGMENTIN) 875-125 MG tablet Take 1 tablet by mouth every 12 (twelve) hours for 10 days. 06/21/22 07/01/22 Yes Becky Augusta, NP  aspirin EC 81 MG tablet Take 81 mg by mouth daily.   Yes [provider]  Erenumab-aooe (AIMOVIG) 140 MG/ML SOAJ INJECT 140 MG SUBCUTANEOUSLY ONCE A MONTH 05/21/20  Yes [provider]  ferrous sulfate 325 (65 FE) MG tablet Take 325 mg by mouth daily with breakfast.   Yes [provider]  frovatriptan (FROVA) 2.5 MG tablet Take by mouth.   Yes [provider]  ibuprofen (ADVIL,MOTRIN) 200 MG tablet Take 200 mg by mouth every 6 (six) hours as needed.   Yes [provider]  ipratropium (ATROVENT) 0.06 % nasal spray Place 2 sprays into both nostrils 4 (four) times daily. 06/21/22  Yes Becky Augusta, NP  meloxicam (MOBIC) 15 MG tablet Take 1  tablet (15 mg total) by mouth daily. 03/08/18  Yes Cuthriell, Delorise Royals, PA-C  methocarbamol (ROBAXIN) 500 MG tablet Take 1 tablet (500 mg total) by mouth 4 (four) times daily. 03/08/18  Yes Cuthriell, Delorise Royals, PA-C  NURTEC 75 MG TBDP Take 1 tablet by mouth as needed. 12/02/21  Yes [provider]    Family History History reviewed. No pertinent family history.  Social History Social History   Tobacco Use   Smoking status: Never   Smokeless tobacco: Never  Vaping Use   Vaping Use: Never used  Substance Use Topics   Alcohol use: No   Drug use: Never     Allergies   Patient has no known allergies.   Review of Systems Review of Systems  Constitutional:  Negative for fever.  HENT:  Positive for congestion, rhinorrhea and sore throat.   Respiratory:  Positive for cough. Negative for shortness of breath and wheezing.   Musculoskeletal:  Positive for arthralgias and myalgias.  Neurological:  Positive for headaches.     Physical Exam Triage Vital Signs ED Triage Vitals  Enc Vitals Group     BP --      Pulse --      Resp --      Temp --      Temp src --      SpO2 --  Weight 06/21/22 1955 200 lb (90.7 kg)     Height 06/21/22 1955 5\' 4"  (1.626 m)     Head Circumference --      Peak Flow --      Pain Score 06/21/22 1954 5     Pain Loc --      Pain Edu? --      Excl. in [place]? --    No data found.  Updated Vital Signs BP (!) 151/103 (BP Location: Left Arm)   Pulse 88   Temp 98.4 F (36.9 C) (Oral)   Resp 16   Ht 5\' 4"  (1.626 m)   Wt 200 lb (90.7 kg)   LMP 08/03/2016   SpO2 94%   BMI 34.33 kg/m   Visual Acuity Right Eye Distance:   Left Eye Distance:   Bilateral Distance:    Right Eye Near:   Left Eye Near:    Bilateral Near:     Physical Exam Vitals and nursing note reviewed.  Constitutional:      Appearance: Normal appearance. She is not ill-appearing.  HENT:     Head: Normocephalic and atraumatic.     Right Ear: Ear canal and  external ear normal. There is no impacted cerumen.     Left Ear: Ear canal and external ear normal. There is no impacted cerumen.     Ears:     Comments: Bilateral tympanic membranes are erythematous and injected with loss of landmarks.  EACs are clear.    Nose: Congestion and rhinorrhea present.     Comments: Nasal mucosa is erythematous and edematous with clear discharge in both nares.    Mouth/Throat:     Mouth: Mucous membranes are moist.     Pharynx: Oropharynx is clear. Posterior oropharyngeal erythema present. No oropharyngeal exudate.     Comments: Patient has erythema injection the posterior oropharynx with clear postnasal drip. Cardiovascular:     Rate and Rhythm: Normal rate and regular rhythm.     Pulses: Normal pulses.     Heart sounds: Normal heart sounds. No murmur heard.    No friction rub. No gallop.  Pulmonary:     Effort: Pulmonary effort is normal.     Breath sounds: Normal breath sounds. No wheezing, rhonchi or rales.  Musculoskeletal:     Cervical back: Normal range of motion and neck supple.  Lymphadenopathy:     Cervical: No cervical adenopathy.  Skin:    General: Skin is warm and dry.     Capillary Refill: Capillary refill takes less than 2 seconds.     Findings: No erythema or rash.  Neurological:     General: No focal deficit present.     Mental Status: She is alert and oriented to person, place, and time.  Psychiatric:        Mood and Affect: Mood normal.        Behavior: Behavior normal.        Thought Content: Thought content normal.        Judgment: Judgment normal.      UC Treatments / Results  Labs (all labs ordered are listed, but only abnormal results are displayed) Labs Reviewed - No data to display  EKG   Radiology No results found.  Procedures Procedures (including critical care time)  Medications Ordered in UC Medications - No data to display  Initial Impression / Assessment and Plan / UC Course  I have reviewed the triage  vital signs and the nursing notes.  Pertinent labs &  imaging results that were available during my care of the patient were reviewed by me and considered in my medical decision making (see chart for details).   Patient is a pleasant, nontoxic-appearing 57 year old female here for evaluation of 14 days of cyclic respiratory symptoms as outlined in HPI above.  On exam she has erythematous injected tympanic membrane's bilaterally.  She also has inflammation of her nasal mucosa with clear rhinorrhea and clear postnasal drip.  Her lungs are clear to auscultation all fields.  Her exam is consistent with bilateral otitis media and also an upper respiratory infection.  Given the duration of symptoms I will put her on Augmentin 875 mg twice daily for 10 days.  Also Atrovent nasal spray to help with the nasal congestion.  Tylenol and ibuprofen as needed for fever and body aches.   Final Clinical Impressions(s) / UC Diagnoses   Final diagnoses:  Upper respiratory tract infection, unspecified type  Non-recurrent acute suppurative otitis media of both ears without spontaneous rupture of tympanic membranes     Discharge Instructions      Take the Augmentin twice daily for 10 days with food for treatment of your ear infection.  Take an over-the-counter probiotic 1 hour after each dose of antibiotic to prevent diarrhea.  Use over-the-counter Tylenol and ibuprofen as needed for pain or fever.  Place a hot water bottle, or heating pad, underneath your pillowcase at night to help dilate up your ear and aid in pain relief as well as resolution of the infection.  Use the Atrovent nasal spray, 2 squirts in each nostril every 6 hours, as needed for congestion and postnasal drip.  Gargle with warm salt water 2-3 times a day to soothe your throat, aid in pain relief, and aid in healing.  Take over-the-counter ibuprofen according to the package instructions as needed for pain.  You can also use Chloraseptic or  Sucrets lozenges, 1 lozenge every 2 hours as needed for throat pain.  If you develop any new or worsening symptoms return for reevaluation.      ED Prescriptions     Medication Sig Dispense Auth. Provider   amoxicillin-clavulanate (AUGMENTIN) 875-125 MG tablet Take 1 tablet by mouth every 12 (twelve) hours for 10 days. 20 tablet Margarette Canada, NP   ipratropium (ATROVENT) 0.06 % nasal spray Place 2 sprays into both nostrils 4 (four) times daily. 15 mL Margarette Canada, NP      PDMP not reviewed this encounter.   Margarette Canada, NP 06/21/22 2008

## 2022-06-22 ENCOUNTER — Ambulatory Visit: Payer: Self-pay

## 2023-12-04 ENCOUNTER — Ambulatory Visit
Admission: EM | Admit: 2023-12-04 | Discharge: 2023-12-04 | Attending: Emergency Medicine | Admitting: Emergency Medicine

## 2023-12-04 DIAGNOSIS — H538 Other visual disturbances: Secondary | ICD-10-CM | POA: Diagnosis not present

## 2023-12-04 DIAGNOSIS — R519 Headache, unspecified: Secondary | ICD-10-CM

## 2023-12-04 NOTE — Discharge Instructions (Addendum)
 I am concerned because you are having a different headache than your typical migraines with blurry vision.  Your visual fields were intact, however, your vision did get blurry when you uncovered your left eye and not your right.  I am concerned that you may have had a stroke, a leaking aneurysm, or mass.  Go immediately to the emergency department and let them know that you are having a severe migraine is not responding to usual medications and that this migraine is different because you are having intermittently blurry vision with blurry vision found on exam.

## 2023-12-04 NOTE — ED Provider Notes (Signed)
 HPI  SUBJECTIVE:  Linda Young is a 58 y.o. female who reports a gradual onset, constant left-sided throbbing headache with photophobia, nausea, phonophobia starting 2 days ago after becoming overheated, which is a known trigger for her migraines.  She reports intermittent bilateral blurry vision that lasts about 5 minutes that has never occurred with previous migraines.  She has tried  Zavzpret, Phenergan , Qulipta without relief.symptoms are better with being in a dark, quiet cold room and worse with lights, noise, heat and strong scents.  No vomiting, double vision, visual loss, ear/jaw/dental pain, nasal congestion, purulent rhinorrhea, neck stiffness, rash.  No tick bite in the past month. No dysarthria, focal weakness, facial droop, discoordination.  No numbness, tingling, weakness in face/arm/leg.  She reports joint aches in both of her shoulders, hips and knees as well.  No neck stiffness, rash. No mental status changes, seizures, syncope. Past medical history negative for aneurysm, SAH/ICH, stroke, atrial fibrillation or temporal arteritis.  FH neg stroke.   She has a past medical history of migraines, polycythemia vera.  History reviewed. No pertinent past medical history.  Past Surgical History:  Procedure Laterality Date   BACK SURGERY      History reviewed. No pertinent family history.  Social History   Tobacco Use   Smoking status: Never   Smokeless tobacco: Never  Vaping Use   Vaping status: Never Used  Substance Use Topics   Alcohol use: No   Drug use: Never    No current facility-administered medications for this encounter.  Current Outpatient Medications:    losartan (COZAAR) 25 MG tablet, Take 25 mg by mouth daily., Disp: , Rfl:    promethazine  (PHENERGAN ) 25 MG tablet, Take 25 mg by mouth every 8 (eight) hours as needed., Disp: , Rfl:    QULIPTA 60 MG TABS, Take 1 tablet by mouth daily., Disp: , Rfl:    ZAVZPRET 10 MG/ACT SOLN, Place into both nostrils.,  Disp: , Rfl:    AJOVY 225 MG/1.5ML SOAJ, SMARTSIG:1 pre-filled pen syringe SUB-Q Once a Month, Disp: , Rfl:    aspirin EC 81 MG tablet, Take 81 mg by mouth daily., Disp: , Rfl:    Erenumab-aooe (AIMOVIG) 140 MG/ML SOAJ, INJECT 140 MG SUBCUTANEOUSLY ONCE A MONTH, Disp: , Rfl:    ferrous sulfate 325 (65 FE) MG tablet, Take 325 mg by mouth daily with breakfast., Disp: , Rfl:    frovatriptan (FROVA) 2.5 MG tablet, Take by mouth., Disp: , Rfl:    ibuprofen (ADVIL,MOTRIN) 200 MG tablet, Take 200 mg by mouth every 6 (six) hours as needed., Disp: , Rfl:    ipratropium (ATROVENT ) 0.06 % nasal spray, Place 2 sprays into both nostrils 4 (four) times daily., Disp: 15 mL, Rfl: 12   meloxicam  (MOBIC ) 15 MG tablet, Take 1 tablet (15 mg total) by mouth daily., Disp: 30 tablet, Rfl: 0   methocarbamol  (ROBAXIN ) 500 MG tablet, Take 1 tablet (500 mg total) by mouth 4 (four) times daily., Disp: 16 tablet, Rfl: 0   NURTEC 75 MG TBDP, Take 1 tablet by mouth as needed., Disp: , Rfl:   No Known Allergies   ROS  As noted in HPI.   Physical Exam  BP 130/87 (BP Location: Right Arm)   Pulse 99   Temp 98.9 F (37.2 C) (Oral)   Resp 16   LMP 08/03/2016   SpO2 95%   Constitutional: Well developed, well nourished, no acute distress.  Sitting in a darkened room. Eyes: PERRL, EOMI, conjunctiva normal  bilaterally.  Bilateral photophobia.  Visual fields intact.  Patient reports blurry vision when she uncovers her left eye, but not her right. HENT: Normocephalic, atraumatic,mucus membranes moist, normal dentition.  TM normal b/l. No TMJ tenderness. No nasal congestion, no sinus tenderness. No temporal artery tenderness.  Neck: no cervical LN. No meningismus Respiratory: normal inspiratory effort Cardiovascular: Normal rate, regular rhythm GI:  nondistended skin: No rash, skin intact Musculoskeletal: No edema, no tenderness, no deformities Neurologic: Alert & oriented x 3, CN III-XII intact, romberg neg, finger->  nose, heel-> shin equal b/l, Romberg neg, tandem gait steady Psychiatric: Speech and behavior appropriate   ED Course   Medications - No data to display  No orders of the defined types were placed in this encounter.  No results found for this or any previous visit (from the past 24 hours). No results found.   ED Clinical Impression  1. Acute nonintractable headache, unspecified headache type   2. Blurry vision, bilateral     ED Assessment/Plan   {The patient has been seen in Urgent Care in the last 3 years. :1  Pt describing typical pain, no sudden onset, however, she states that this headache is different because she is having intermittently blurry vision and it is not responding to usual medications.  She has had migraines for a long time, and has never had issues with her vision.  Pt without fevers/chills, Pt has no meningeal sx, no nuchal rigidity. Doubt meningitis.  She has a normal neurologic exam.  I am concerned that she may have had a CVA, leaking aneurysm.  Mass in the differential.  Because she has a normal neurologic exam and symptoms have been going on constantly for 2 days, I believe she is stable to go by private vehicle.  Transferring to the [facility] emergency department via private vehicle for comprehensive evaluation.  Discussed rationale for transfer to the emergency department with the patient.  She agrees to go.   No orders of the defined types were placed in this encounter.   *This clinic note was created using Dragon dictation software. Therefore, there may be occasional mistakes despite careful proofreading.  ?    Van Knee, MD 12/04/23 2032

## 2023-12-04 NOTE — ED Triage Notes (Signed)
 Hx of migraines  Migraine since Sunday. Patient has used her medications with no relief.   Zavzpret Phenergan  [place]

## 2023-12-04 NOTE — ED Notes (Signed)
 Patient is being discharged from the Urgent Care and sent to the Emergency Department via POV . Per Van Knee, MD , patient is in need of higher level of care due to migraine with blurry vision. Patient is aware and verbalizes understanding of plan of care.  Vitals:   12/04/23 1838  BP: 130/87  Pulse: 99  Resp: 16  Temp: 98.9 F (37.2 C)  SpO2: 95%
